# Patient Record
Sex: Male | Born: 2000 | Race: White | Hispanic: Yes | Marital: Single | Smoking: Never smoker
Health system: Southern US, Community
[De-identification: ages and names within clinical notes are randomized; demographics above are authoritative.]

## PROBLEM LIST (undated history)

## (undated) HISTORY — PX: SURGERY SCROTAL / TESTICULAR: SUR1316

---
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
## 2019-02-21 ENCOUNTER — Other Ambulatory Visit: Payer: Self-pay

## 2019-02-21 DIAGNOSIS — Z20822 Contact with and (suspected) exposure to covid-19: Secondary | ICD-10-CM

## 2019-02-24 LAB — NOVEL CORONAVIRUS, NAA: SARS-CoV-2, NAA: NOT DETECTED

## 2019-03-05 ENCOUNTER — Encounter (HOSPITAL_COMMUNITY): Payer: Self-pay | Admitting: Emergency Medicine
